# Patient Record
Sex: Female | Born: 1995 | Race: White | Hispanic: No | Marital: Single | Smoking: Never smoker
Health system: Southern US, Community
[De-identification: ages and names within clinical notes are randomized; demographics above are authoritative.]

## PROBLEM LIST (undated history)

## (undated) HISTORY — PX: MEDIAL COLLATERAL LIGAMENT REPAIR, KNEE: SHX2019

---
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
## 2013-03-26 ENCOUNTER — Emergency Department: Payer: Self-pay | Admitting: Emergency Medicine

## 2013-03-26 LAB — URINALYSIS, COMPLETE
BILIRUBIN, UR: NEGATIVE
Glucose,UR: NEGATIVE mg/dL (ref 0–75)
Ketone: NEGATIVE
Leukocyte Esterase: NEGATIVE
NITRITE: NEGATIVE
PH: 6 (ref 4.5–8.0)
PROTEIN: NEGATIVE
RBC,UR: 2 /HPF (ref 0–5)
SPECIFIC GRAVITY: 1.013 (ref 1.003–1.030)
Squamous Epithelial: 1
WBC UR: 1 /HPF (ref 0–5)

## 2013-03-28 LAB — URINE CULTURE

## 2013-03-29 LAB — BETA STREP CULTURE(ARMC)

## 2016-02-20 ENCOUNTER — Emergency Department: Payer: 59

## 2016-02-20 ENCOUNTER — Emergency Department
Admission: EM | Admit: 2016-02-20 | Discharge: 2016-02-21 | Disposition: A | Discharge status: Home / Self Care | Payer: 59 | Attending: Emergency Medicine | Admitting: Emergency Medicine

## 2016-02-20 DIAGNOSIS — R110 Nausea: Secondary | ICD-10-CM | POA: Diagnosis not present

## 2016-02-20 DIAGNOSIS — R42 Dizziness and giddiness: Secondary | ICD-10-CM

## 2016-02-20 DIAGNOSIS — R002 Palpitations: Secondary | ICD-10-CM | POA: Insufficient documentation

## 2016-02-20 LAB — URINALYSIS, COMPLETE (UACMP) WITH MICROSCOPIC
Bacteria, UA: NONE SEEN
Bilirubin Urine: NEGATIVE
Glucose, UA: NEGATIVE mg/dL
Hgb urine dipstick: NEGATIVE
Ketones, ur: NEGATIVE mg/dL
Leukocytes, UA: NEGATIVE
Nitrite: NEGATIVE
PROTEIN: NEGATIVE mg/dL
Specific Gravity, Urine: 1.015 (ref 1.005–1.030)
pH: 6 (ref 5.0–8.0)

## 2016-02-20 LAB — BASIC METABOLIC PANEL
Anion gap: 7 (ref 5–15)
BUN: 13 mg/dL (ref 6–20)
CALCIUM: 9.1 mg/dL (ref 8.9–10.3)
CO2: 27 mmol/L (ref 22–32)
Chloride: 103 mmol/L (ref 101–111)
Creatinine, Ser: 0.88 mg/dL (ref 0.44–1.00)
GFR calc Af Amer: >60 mL/min (ref >60)
GFR calc non Af Amer: >60 mL/min (ref >60)
GLUCOSE: 98 mg/dL (ref 65–99)
Potassium: 4 mmol/L (ref 3.5–5.1)
Sodium: 137 mmol/L (ref 135–145)

## 2016-02-20 LAB — CBC
HCT: 40.3 % (ref 35.0–47.0)
HEMOGLOBIN: 13.7 g/dL (ref 12.0–16.0)
MCH: 29.2 pg (ref 26.0–34.0)
MCHC: 34 g/dL (ref 32.0–36.0)
MCV: 86 fL (ref 80.0–100.0)
Platelets: 342 10*3/uL (ref 150–440)
RBC: 4.69 MIL/uL (ref 3.80–5.20)
RDW: 13.7 % (ref 11.5–14.5)
WBC: 9.6 10*3/uL (ref 3.6–11.0)

## 2016-02-20 LAB — POCT PREGNANCY, URINE: Preg Test, Ur: NEGATIVE

## 2016-02-20 LAB — TROPONIN I

## 2016-02-20 NOTE — ED Provider Notes (Signed)
[facility] Emergency Department Provider Note        Time seen: ----------------------------------------- 11:14 PM on 02/20/2016 -----------------------------------------    I have reviewed the triage vital signs and the nursing notes.   HISTORY  Chief Complaint Dizziness and Nausea    HPI Michelle Atkinson is a 21 y.o. female presents to ER for dizziness and nausea started a month ago that worsened today. Patient denies any vomiting or diarrhea. She states her blood pressure has been elevated, she's been having some chest pain and palpitations. Patient states her heart is not racing but she can feel it beating more forcefully. She's been taking over-the-counter medications without relief.   History reviewed. No pertinent past medical history.  There are no active problems to display for this patient.   Past Surgical History:  Procedure Laterality Date  . mcl surgery      Allergies Patient has no allergy information on record.  Social History Social History  Substance Use Topics  . Smoking status: Never Smoker  . Smokeless tobacco: Never Used  . Alcohol use Yes    Review of Systems Constitutional: Negative for fever. Cardiovascular: Negative for chest pain.Positive for palpitations Respiratory: Negative for shortness of breath. Gastrointestinal: Negative for abdominal pain, Positive for nausea Genitourinary: Negative for dysuria. Musculoskeletal: Negative for back pain. Skin: Negative for rash. Neurological: Negative for headaches, focal weakness or numbness.  10-point ROS otherwise negative.  ____________________________________________   PHYSICAL EXAM:  VITAL SIGNS: ED Triage Vitals [02/20/16 2206]  Enc Vitals Group     BP      Pulse      Resp      Temp      Temp src      SpO2      Weight 145 lb (65.8 kg)     Height 5\' 6"  (1.676 m)     Head Circumference      Peak Flow      Pain Score 7     Pain Loc      Pain Edu?       Excl. in GC?     Constitutional: Alert and oriented. Well appearing and in no distress. Eyes: Conjunctivae are normal. PERRL. Normal extraocular movements. ENT   Head: Normocephalic and atraumatic.   Nose: No congestion/rhinnorhea.   Mouth/Throat: Mucous membranes are moist.   Neck: No stridor. Cardiovascular: Normal rate, regular rhythm. No murmurs, rubs, or gallops. Respiratory: Normal respiratory effort without tachypnea nor retractions. Breath sounds are clear and equal bilaterally. No wheezes/rales/rhonchi. Gastrointestinal: Soft and nontender. Normal bowel sounds Musculoskeletal: Nontender with normal range of motion in all extremities. No lower extremity tenderness nor edema. Neurologic:  Normal speech and language. No gross focal neurologic deficits are appreciated.  Skin:  Skin is warm, dry and intact. No rash noted. Psychiatric: Mood and affect are normal. Speech and behavior are normal.  ____________________________________________  EKG: Interpreted by me. Sinus rhythm with rate of 70 bpm, normal PR interval, normal QRS, normal QT.  ____________________________________________  ED COURSE:  Pertinent labs & imaging results that were available during my care of the patient were reviewed by me and considered in my medical decision making (see chart for details). Patient is in no distress, we will assess with labs and possibly imaging.   Procedures ____________________________________________   LABS (pertinent positives/negatives)  Labs Reviewed  URINALYSIS, COMPLETE (UACMP) WITH MICROSCOPIC - Abnormal; Notable for the following:       Result Value   Color, Urine YELLOW (*)  APPearance CLEAR (*)    Squamous Epithelial / LPF 0-5 (*)    All other components within normal limits  BASIC METABOLIC PANEL  CBC  TROPONIN I  POCT PREGNANCY, URINE  CBG MONITORING, ED  POC URINE PREG, ED   Chest x-ray is  unremarkable ____________________________________________  FINAL ASSESSMENT AND PLAN  Dizziness  Plan: Patient with labs and imaging as dictated above. No specific etiology for her symptoms. Suspect underlying anxiety, she'll be discharged with Ativan and encouraged close outpatient follow-up with her doctor.   Emily Filbert, MD   Note: This note was generated in part or whole with voice recognition software. Voice recognition is usually quite accurate but there are transcription errors that can and very often do occur. I apologize for any typographical errors that were not detected and corrected.     Emily Filbert, MD 02/20/16 [phone]

## 2016-02-20 NOTE — ED Triage Notes (Signed)
Pt presents to ED with c/o dizziness and nausea that started a month ago that worsened today. Pt denies any c/o emesis, denies diarrhea. Pt reports "accelaterated BP, chest pain, and I can feel every heartbeat strongly". Pt reports taking Advil and Pepto-Bismol without relief. Pt also reports "loosing feeling in my right arm, especially when I work out" that started 2 weeks ago.

## 2016-02-21 MED ORDER — LORAZEPAM 1 MG PO TABS: 1.0000 mg | ORAL_TABLET | Freq: Two times a day (BID) | ORAL | 0 refills | Status: AC

## 2016-02-21 NOTE — ED Notes (Signed)
Pt talks in complete sentences ambulatory, reports no pain, discussed with pt the importance of drinking water, avoiding caffeine drinks. Pt denies any other symptom at present.

## 2016-02-21 NOTE — ED Notes (Signed)
Pt verbalizes understanding of discharge instructions.

## 2016-05-28 ENCOUNTER — Encounter: Payer: Self-pay | Admitting: Internal Medicine

## 2016-05-28 ENCOUNTER — Other Ambulatory Visit (INDEPENDENT_AMBULATORY_CARE_PROVIDER_SITE_OTHER): Payer: PRIVATE HEALTH INSURANCE

## 2016-05-28 ENCOUNTER — Ambulatory Visit (INDEPENDENT_AMBULATORY_CARE_PROVIDER_SITE_OTHER): Payer: 59 | Admitting: Internal Medicine

## 2016-05-28 ENCOUNTER — Encounter (INDEPENDENT_AMBULATORY_CARE_PROVIDER_SITE_OTHER): Payer: Self-pay

## 2016-05-28 VITALS — BP 108/60 | HR 84 | Ht 66.0 in | Wt 146.0 lb

## 2016-05-28 DIAGNOSIS — R1013 Epigastric pain: Secondary | ICD-10-CM | POA: Diagnosis not present

## 2016-05-28 DIAGNOSIS — R110 Nausea: Secondary | ICD-10-CM

## 2016-05-28 LAB — COMPREHENSIVE METABOLIC PANEL
ALT: 18 U/L (ref 0–35)
AST: 20 U/L (ref 0–37)
Albumin: 4.6 g/dL (ref 3.5–5.2)
Alkaline Phosphatase: 50 U/L (ref 39–117)
BUN: 14 mg/dL (ref 6–23)
CO2: 29 mEq/L (ref 19–32)
Calcium: 9.5 mg/dL (ref 8.4–10.5)
Chloride: 104 mEq/L (ref 96–112)
Creatinine, Ser: 0.87 mg/dL (ref 0.40–1.20)
GFR: 86.23 mL/min (ref >60.00)
Glucose, Bld: 92 mg/dL (ref 70–99)
POTASSIUM: 4.5 meq/L (ref 3.5–5.1)
SODIUM: 139 meq/L (ref 135–145)
Total Bilirubin: 0.4 mg/dL (ref 0.2–1.2)
Total Protein: 7.2 g/dL (ref 6.0–8.3)

## 2016-05-28 LAB — HIGH SENSITIVITY CRP: CRP HIGH SENSITIVITY: 1.23 mg/L (ref 0.000–5.000)

## 2016-05-28 LAB — CBC WITH DIFFERENTIAL/PLATELET
BASOS ABS: 0 10*3/uL (ref 0.0–0.1)
BASOS PCT: 0.4 % (ref 0.0–3.0)
EOS ABS: 0.2 10*3/uL (ref 0.0–0.7)
Eosinophils Relative: 2.6 % (ref 0.0–5.0)
HCT: 41.8 % (ref 36.0–46.0)
HEMOGLOBIN: 14.2 g/dL (ref 12.0–15.0)
LYMPHS PCT: 25.1 % (ref 12.0–46.0)
Lymphs Abs: 2 10*3/uL (ref 0.7–4.0)
MCHC: 33.9 g/dL (ref 30.0–36.0)
MCV: 86 fl (ref 78.0–100.0)
MONO ABS: 0.4 10*3/uL (ref 0.1–1.0)
Monocytes Relative: 5.4 % (ref 3.0–12.0)
NEUTROS ABS: 5.2 10*3/uL (ref 1.4–7.7)
Neutrophils Relative %: 66.5 % (ref 43.0–77.0)
PLATELETS: 343 10*3/uL (ref 150.0–400.0)
RBC: 4.86 Mil/uL (ref 3.87–5.11)
RDW: 12.6 % (ref 11.5–15.5)
WBC: 7.8 10*3/uL (ref 4.0–10.5)

## 2016-05-28 LAB — TSH: TSH: 3.18 u[IU]/mL (ref 0.35–4.50)

## 2016-05-28 LAB — IGA: IgA: 123 mg/dL (ref 68–378)

## 2016-05-28 LAB — LIPASE: LIPASE: 15 U/L (ref 11.0–59.0)

## 2016-05-28 MED ORDER — ONDANSETRON 4 MG PO TBDP: ORAL_TABLET | ORAL | 1 refills | Status: AC

## 2016-05-28 MED ORDER — PANTOPRAZOLE SODIUM 40 MG PO TBEC: 40.0000 mg | DELAYED_RELEASE_TABLET | Freq: Two times a day (BID) | ORAL | 2 refills | Status: DC

## 2016-05-28 MED FILL — ONDANSETRON ODT 4 MG TABLET: 4 | 5 days supply | Qty: 30 | Fill #0

## 2016-05-28 MED FILL — PANTOPRAZOLE SOD DR 40 MG T: 40 | 30 days supply | Qty: 60 | Fill #0

## 2016-05-28 NOTE — Progress Notes (Signed)
Patient ID: Michelle Atkinson, female   DOB: [birth year], 21 y.o.   MRN: [id number] HPI: Michelle Atkinson is a 21 yo female currently a senior at [employer] with no significant PMH here for 6 months of now worsening nausea, associated with upper abdominal pain.  She does have a history of vertigo but this has improved. Initially nausea was associated with vertigo but vertigo has not been an issue recently and nausea has worsened.  She reports her symptoms started with nausea and heartburn/reflux type symptoms around October or November 2017. She's had some epigastric discomfort and stomach "churning". This is a gnawing type discomfort in the left upper quadrant and epigastrium. She reports she has no dysphagia or odynophagia. She has not weight. She does at times feel full quickly. She has been using Pepto-Bismol frequently throughout the day. With this her stools have been dark at times black. Also she's noticed a change in her bowel habits. Stools have been looser and at 3-4 times per day. Despite her nausea which is frequent and daily she has not had vomiting. She freely feels dehydrated.  She started omeprazole over-the-counter in early March. This helped "a lot". She completed a 14 day course and when discontinuing omeprazole symptoms returned severely. She saw her primary care provider in [place], where she is from and she increased her omeprazole to 40 mg daily. This was helpful but not as much as previously and so she changed herself to 20 mg twice a day before meals. This has helped but nausea persists.  She reports she does drink alcohol and at times this helps her nausea because it "makes me non-". She does not use marijuana or other illicit drugs. Tobacco.  No significant family history that her grandfather had Crohn's disease. No known family history of GI tract malignancy or celiac disease.  She does report having been under stress recently in her senior year in college. She has been interviewing for a  job with the [employer]. She should hear about this soon. She has majored in sports management. Graduation is about 3 weeks away.   History reviewed. No pertinent past medical history.  Past Surgical History:  Procedure Laterality Date  . MEDIAL COLLATERAL LIGAMENT REPAIR, KNEE Right    Meds: Omeprazole 20 mg BIDAC Ativan on the list -- not using  No Known Allergies  Family History  Problem Relation Age of Onset  . Crohn's disease Maternal Grandfather     Social History  Substance Use Topics  . Smoking status: Never Smoker  . Smokeless tobacco: Never Used  . Alcohol use Yes     Comment: 12 drinks weekly     ROS: As per history of present illness, otherwise negative  BP 108/60   Pulse 84   Ht  (1.676 m)   Wt 146 lb (66.2 kg)   BMI 23.57 kg/m  Constitutional: Well-developed and well-nourished. No distress. HEENT: Normocephalic and atraumatic. Oropharynx is clear and moist. Conjunctivae are normal.  No scleral icterus. Neck: Neck supple. Trachea midline. Cardiovascular: Normal rate, regular rhythm and intact distal pulses. No M/R/G Pulmonary/chest: Effort normal and breath sounds normal. No wheezing, rales or rhonchi. Abdominal: Soft, mild epigastric tenderness, no rebound/guarding, nontender, nondistended. Bowel sounds active throughout. There are no masses palpable. No hepatosplenomegaly. Extremities: no clubbing, cyanosis, or edema Neurological: Alert and oriented to person place and time. Skin: Skin is warm and dry.  Psychiatric: Normal mood and affect. Behavior is normal.  RELEVANT LABS AND IMAGING: CBC  Component Value Date/Time   WBC 9.6 02/20/2016 2208   RBC 4.69 02/20/2016 2208   HGB 13.7 02/20/2016 2208   HCT 40.3 02/20/2016 2208   PLT 342 02/20/2016 2208   MCV 86.0 02/20/2016 2208   MCH 29.2 02/20/2016 2208   MCHC 34.0 02/20/2016 2208   RDW 13.7 02/20/2016 2208    CMP     Component Value Date/Time   NA 137 02/20/2016 2208   K 4.0  02/20/2016 2208   CL 103 02/20/2016 2208   CO2 27 02/20/2016 2208   GLUCOSE 98 02/20/2016 2208   BUN 13 02/20/2016 2208   CREATININE 0.88 02/20/2016 2208   CALCIUM 9.1 02/20/2016 2208   GFRNONAA >60 02/20/2016 2208   GFRAA >60 02/20/2016 2208    ASSESSMENT/PLAN: 21 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 Severe nausea/mild epigastric pain -- symptoms are most consistent with dyspepsia partially PPI responsive. Gallbladder pathology also considered but felt less likely. H. pylori is a possibility. Will workup as follows: CBC, CMP, CRP, celiac disease, lipase and TSH. Upper endoscopy recommended to evaluate for ulcer disease and rule out H. pylori. We discussed the risks, benefits and alternatives and she is agreeable and wishes to proceed. Given lack of complete response to omeprazole will try pantoprazole 40 mg twice a day before meals. Zofran 4 mg 1-2 tablets every 8 hours as needed for nausea. Abdominal ultrasound to rule out gallbladder pathology.

## 2016-05-28 NOTE — Patient Instructions (Addendum)
Your physician has requested that you go to the basement for the following lab work before leaving today: CBC, CMP, CRP, CELIAC, TSH,LIPASE   You have been scheduled for an endoscopy. Please follow written instructions given to you at your visit today. If you use inhalers (even only as needed), please bring them with you on the day of your procedure. Your physician has requested that you go to www.startemmi.com and enter the access code given to you at your visit today. This web site gives a general overview about your procedure. However, you should still follow specific instructions given to you by our office regarding your preparation for the procedure.  We have sent the following medications to your pharmacy for you to pick up at your convenience: Zofran Pantoprazole (in place of omeprazole)  Discontinue omeprazole.  You have been scheduled for an abdominal ultrasound at [facility] (1st floor of hospital) on Friday 06/04/16 at 8:00 am. Please arrive 15 minutes prior to your appointment for registration. Make certain not to have anything to eat or drink 6 hours prior to your appointment. Should you need to reschedule your appointment, please contact radiology at [phone]. This test typically takes about 30 minutes to perform.  If you are age 63 or older, your body mass index should be between 23-30. Your Body mass index is 23.57 kg/m. If this is out of the aforementioned range listed, please consider follow up with your Primary Care Provider.  If you are age 27 or younger, your body mass index should be between 19-25. Your Body mass index is 23.57 kg/m. If this is out of the aformentioned range listed, please consider follow up with your Primary Care Provider.

## 2016-05-31 LAB — TISSUE TRANSGLUTAMINASE, IGA: Tissue Transglutaminase Ab, IgA: 1 U/mL (ref <4)

## 2016-06-04 ENCOUNTER — Ambulatory Visit (HOSPITAL_COMMUNITY)
Admission: RE | Admit: 2016-06-04 | Discharge: 2016-06-04 | Disposition: A | Discharge status: Home / Self Care | Payer: PRIVATE HEALTH INSURANCE | Source: Ambulatory Visit | Attending: Internal Medicine | Admitting: Internal Medicine

## 2016-06-04 DIAGNOSIS — R110 Nausea: Secondary | ICD-10-CM | POA: Diagnosis not present

## 2016-06-04 DIAGNOSIS — R1013 Epigastric pain: Secondary | ICD-10-CM | POA: Insufficient documentation

## 2016-06-10 ENCOUNTER — Encounter: Payer: Self-pay | Admitting: Internal Medicine

## 2016-06-10 ENCOUNTER — Ambulatory Visit (AMBULATORY_SURGERY_CENTER): Payer: PRIVATE HEALTH INSURANCE | Admitting: Internal Medicine

## 2016-06-10 ENCOUNTER — Telehealth: Payer: Self-pay | Admitting: Gastroenterology

## 2016-06-10 VITALS — BP 108/60 | HR 71 | Temp 97.8°F | Resp 12 | Ht 66.0 in | Wt 146.0 lb

## 2016-06-10 DIAGNOSIS — R110 Nausea: Secondary | ICD-10-CM

## 2016-06-10 DIAGNOSIS — K2980 Duodenitis without bleeding: Secondary | ICD-10-CM | POA: Diagnosis not present

## 2016-06-10 DIAGNOSIS — K2950 Unspecified chronic gastritis without bleeding: Secondary | ICD-10-CM

## 2016-06-10 MED ORDER — SODIUM CHLORIDE 0.9 % IV SOLN: 500.0000 mL | INTRAVENOUS | Status: AC

## 2016-06-10 NOTE — Telephone Encounter (Signed)
Patient called this evening. She had an EGD this morning with Dr. Rhea BeltonPyrtle. She states within the past hour having some fleeting chest discomfort / epigastric pain. She has felt fine after the procedure until the past hour. No vomiting, no fevers. No shortness of breath or breathing difficulty. She's not sure if she is having heartburn. She has been tolerating PO okay after the procedure.  She had a fairly unremarkable EGD, no high risk interventions, some biopsies taken from the stomach / small bowel, low risk for complication from this type of procedure. Recommend she try some TUMS she has at home and take her protonix (she hasn't taken it today). Will see if this helps. I reviewed alarm symptoms with her which would warrant going to the hospital overnight - she has none of these at present as above. If she feels worse overnight she can call me back   I told her we would call her tomorrow to check up on her.    Alroy DustJay, FYI

## 2016-06-10 NOTE — Op Note (Signed)
[facility] Patient Name: Michelle Atkinson Procedure Date: 06/10/2016 8:09 AM MRN: [id number] Endoscopist: Michelle Fiedler , MD Age: 21 Referring MD:  Date of Birth: 19-Mar-1995 Gender: Female Account #: [id number] Procedure:                Upper GI endoscopy Indications:              Epigastric abdominal pain, Nausea Medicines:                Monitored Anesthesia Care Procedure:                Pre-Anesthesia Assessment:                           - Prior to the procedure, a History and Physical                            was performed, and patient medications and                            allergies were reviewed. The patient's tolerance of                            previous anesthesia was also reviewed. The risks                            and benefits of the procedure and the sedation                            options and risks were discussed with the patient.                            All questions were answered, and informed consent                            was obtained. Prior Anticoagulants: The patient has                            taken no previous anticoagulant or antiplatelet                            agents. ASA Grade Assessment: I - A normal, healthy                            patient. After reviewing the risks and benefits,                            the patient was deemed in satisfactory condition to                            undergo the procedure.                           After obtaining informed consent, the endoscope was  passed under direct vision. Throughout the                            procedure, the patient's blood pressure, pulse, and                            oxygen saturations were monitored continuously. The                            Model GIF-HQ190 [id number] scope was introduced                            through the mouth, and advanced to the second part                            of duodenum. The upper GI  endoscopy was                            accomplished without difficulty. The patient                            tolerated the procedure well. Scope In: Scope Out: Findings:                 The examined esophagus was normal.                           The entire examined stomach was normal. Biopsies                            were taken with a cold forceps for histology and                            Helicobacter pylori testing.                           The cardia and gastric fundus were normal on                            retroflexion.                           A few localized pinpoint erosions without bleeding                            were found in the duodenal bulb.                           The second portion of the duodenum was normal.                            Biopsies for histology were taken with a cold                            forceps for evaluation of celiac disease. Complications:  No immediate complications. Estimated Blood Loss:     Estimated blood loss was minimal. Impression:               - Normal esophagus.                           - Normal stomach. Biopsied.                           - Duodenal erosions without bleeding.                           - Normal second portion of the duodenum. Biopsied. Recommendation:           - Patient has a contact number available for                            emergencies. The signs and symptoms of potential                            delayed complications were discussed with the                            patient. Return to normal activities tomorrow.                            Written discharge instructions were provided to the                            patient.                           - Resume previous diet.                           - Continue present medications including                            pantoprazole 40 mg twice daily and as needed Zofran                            (ondansetron).                            - Await pathology results.                           - Avoid ibuprofen, naproxen, or other non-steroidal                            anti-inflammatory drugs. Michelle FiedlerJay M Chrysta Fulcher, MD 06/10/2016 8:34:50 AM This report has been signed electronically.

## 2016-06-10 NOTE — Progress Notes (Signed)
Called to room to assist during endoscopic procedure.  Patient ID and intended procedure confirmed with present staff. Received instructions for my participation in the procedure from the performing physician.  

## 2016-06-10 NOTE — Progress Notes (Signed)
Report to PACU, RN, vss, BBS= Clear.  

## 2016-06-10 NOTE — Progress Notes (Signed)
No problems noted in the recovery room. maw 

## 2016-06-10 NOTE — Progress Notes (Signed)
Pt's states no medical or surgical changes since previsit or office visit. 

## 2016-06-10 NOTE — Patient Instructions (Signed)
YOU HAD AN ENDOSCOPIC PROCEDURE TODAY AT THE [facility]:   Refer to the procedure report that was given to you for any specific questions about what was found during the examination.  If the procedure report does not answer your questions, please call your gastroenterologist to clarify.  If you requested that your care partner not be given the details of your procedure findings, then the procedure report has been included in a sealed envelope for you to review at your convenience later.  YOU SHOULD EXPECT: Some feelings of bloating in the abdomen. Passage of more gas than usual.  Walking can help get rid of the air that was put into your GI tract during the procedure and reduce the bloating. If you had a lower endoscopy (such as a colonoscopy or flexible sigmoidoscopy) you may notice spotting of blood in your stool or on the toilet paper. If you underwent a bowel prep for your procedure, you may not have a normal bowel movement for a few days.  Please Note:  You might notice some irritation and congestion in your nose or some drainage.  This is from the oxygen used during your procedure.  There is no need for concern and it should clear up in a day or so.  SYMPTOMS TO REPORT IMMEDIATELY:    Following upper endoscopy (EGD)  Vomiting of blood or coffee ground material  New chest pain or pain under the shoulder blades  Painful or persistently difficult swallowing  New shortness of breath  Fever of 100F or higher  Black, tarry-looking stools  For urgent or emergent issues, a gastroenterologist can be reached at any hour by calling [phone].   DIET:  We do recommend a small meal at first, but then you may proceed to your regular diet.  Drink plenty of fluids but you should avoid alcoholic beverages for 24 hours.  ACTIVITY:  You should plan to take it easy for the rest of today and you should NOT DRIVE or use heavy machinery until tomorrow (because of the sedation medicines used  during the test).    FOLLOW UP: Our staff will call the number listed on your records the next business day following your procedure to check on you and address any questions or concerns that you may have regarding the information given to you following your procedure. If we do not reach you, we will leave a message.  However, if you are feeling well and you are not experiencing any problems, there is no need to return our call.  We will assume that you have returned to your regular daily activities without incident.  If any biopsies were taken you will be contacted by phone or by letter within the next 1-3 weeks.  Please call us at [phone] if you have not heard about the biopsies in 3 weeks.    SIGNATURES/CONFIDENTIALITY: You and/or your care partner have signed paperwork which will be entered into your electronic medical record.  These signatures attest to the fact that that the information above on your After Visit Summary has been reviewed and is understood.  Full responsibility of the confidentiality of this discharge information lies with you and/or your care-partner.    Continue taking pantorrazole 40 mg twice daily and as needed zofran. Avoid ibuprofen, naproxen, pr other non-steroidal anti-inflammatory drugs. You may resume your other current medications today. Await biopsy results. Please call if any questions or concerns.

## 2016-06-11 ENCOUNTER — Telehealth: Payer: Self-pay

## 2016-06-11 ENCOUNTER — Telehealth: Payer: Self-pay | Admitting: *Deleted

## 2016-06-11 NOTE — Telephone Encounter (Signed)
Pt states that her stomach is still a little off but she has not had any more chest discomfort. Pt knows to take her protonix and tums as needed and to call back if she has any more problems. Pt verbalized understanding.

## 2016-06-11 NOTE — Telephone Encounter (Signed)
No answer. Number identifier. Message left to call if questions or concerns. 

## 2016-06-11 NOTE — Telephone Encounter (Signed)
No answer. Number identifier. Message left to call if questions or concerns and attempt will be made later in the day to reach.

## 2016-06-11 NOTE — Telephone Encounter (Signed)
Linda Please call the patient to check on her.  If worse needs to be seen or to urgent care for 2v cxr and abd series Doubt complication from egd Thanks Brett CanalesSteve for your help last night

## 2016-06-17 ENCOUNTER — Other Ambulatory Visit: Payer: Self-pay

## 2016-07-30 ENCOUNTER — Telehealth: Payer: Self-pay | Admitting: Internal Medicine

## 2016-07-30 MED ORDER — PANTOPRAZOLE SODIUM 40 MG PO TBEC: 40.0000 mg | DELAYED_RELEASE_TABLET | Freq: Two times a day (BID) | ORAL | 2 refills | Status: AC

## 2016-07-30 NOTE — Telephone Encounter (Signed)
Rx sent 

## 2018-03-01 IMAGING — US US ABDOMEN COMPLETE
1 series · 14 of 25 positions shown · non-contrast
Comparison: None.

CLINICAL DATA: Intermittent abdominal pain.

EXAM:
ABDOMEN ULTRASOUND COMPLETE

[Series 1: us abdomen complete · 0.20mm/px · 14 of 99 slices shown]
[im 1/99]
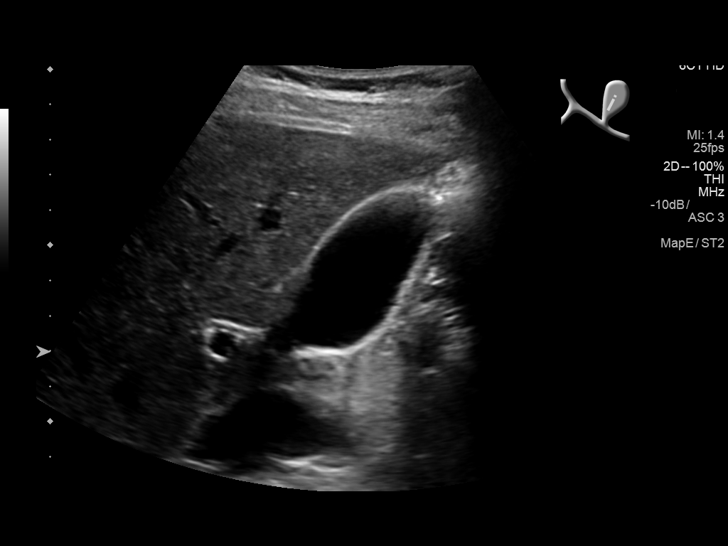
[im 9/99]
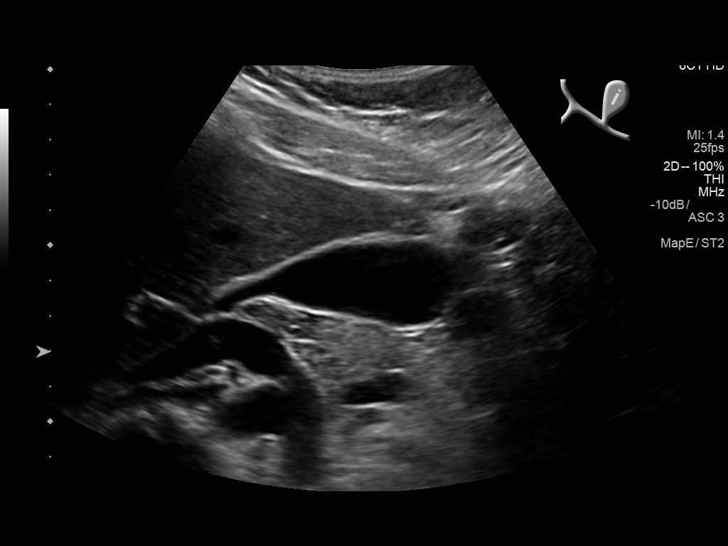
[im 17/99]
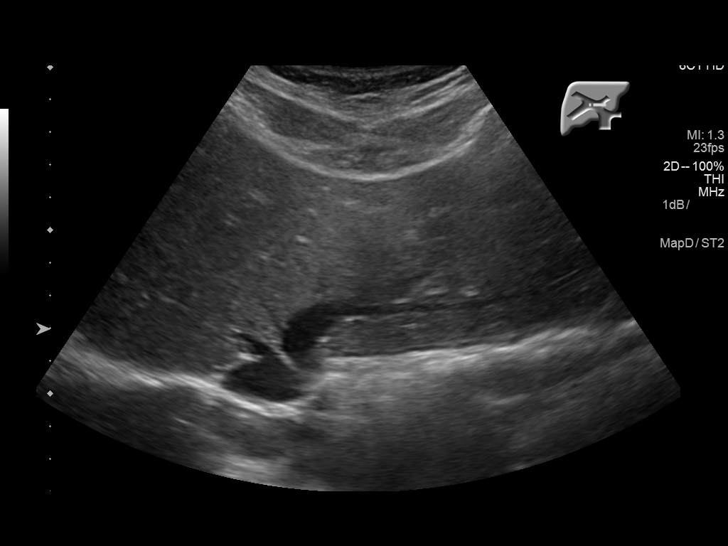
[im 25/99]
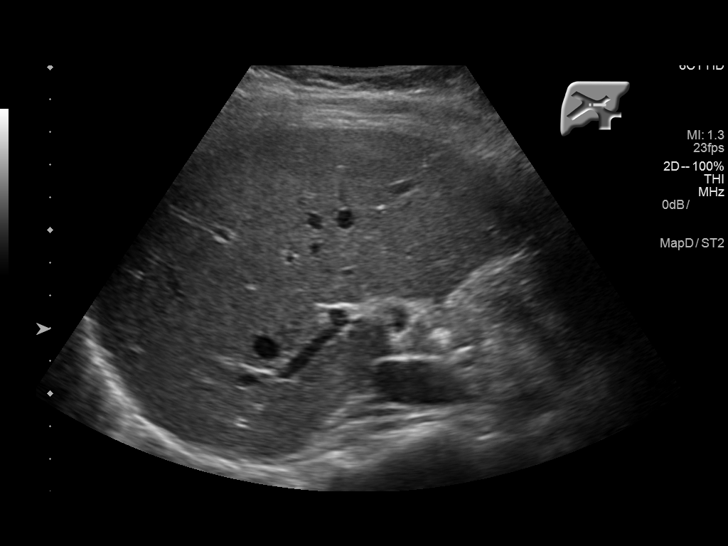
[im 33/99]
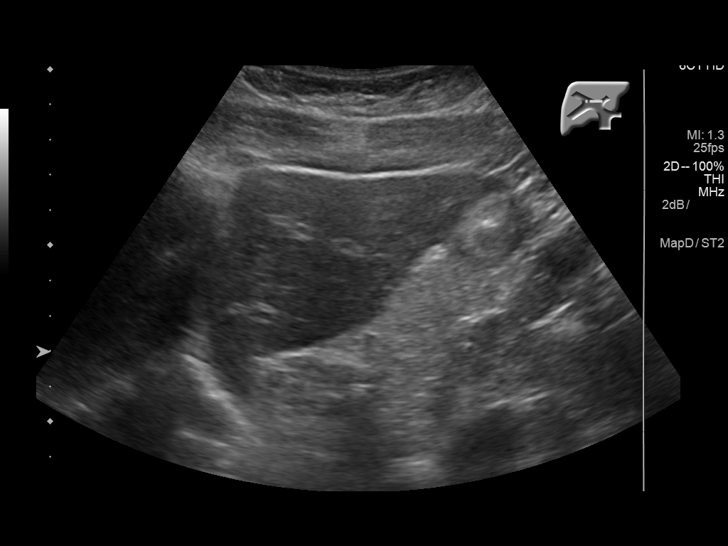
[im 37/99]
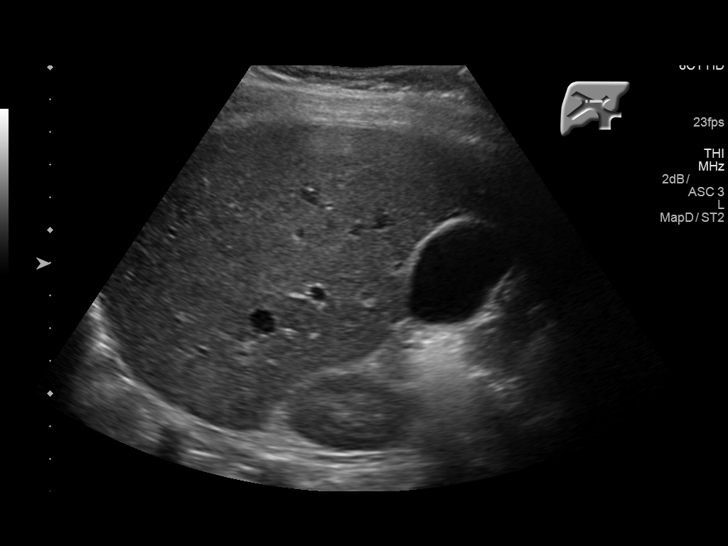
[im 45/99]
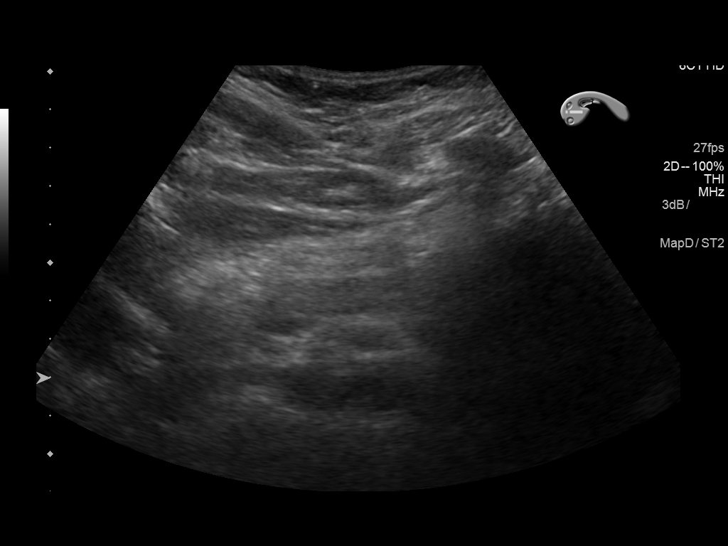
[im 54/99]
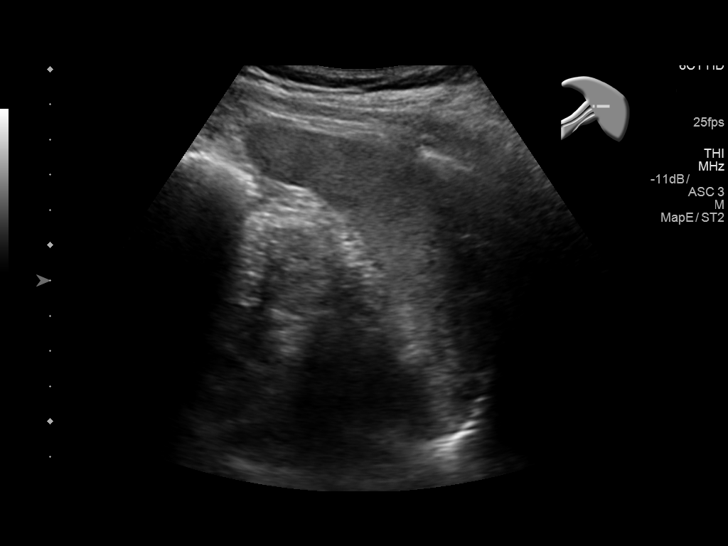
[im 62/99]
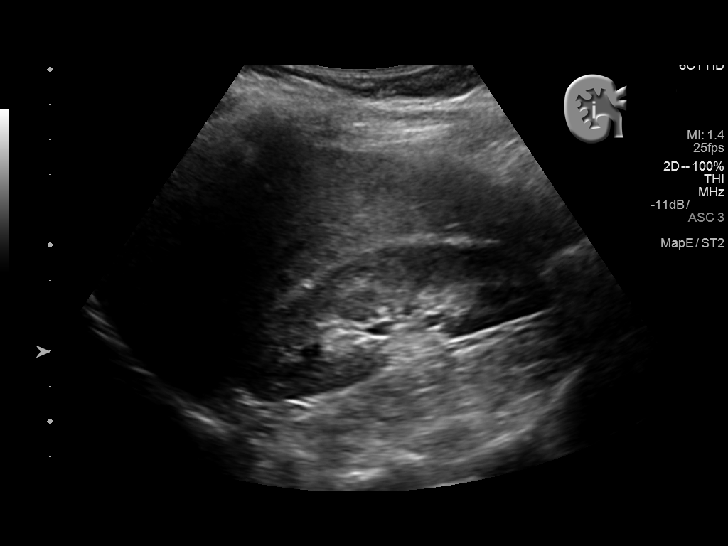
[im 66/99]
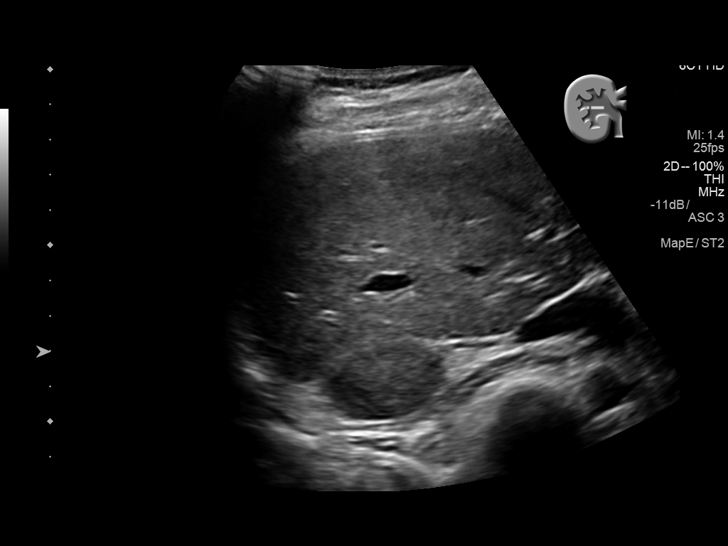
[im 74/99]
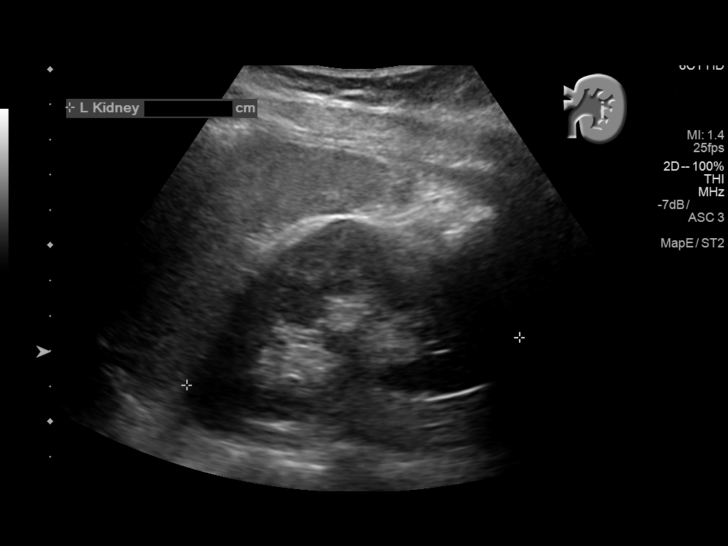
[im 82/99]
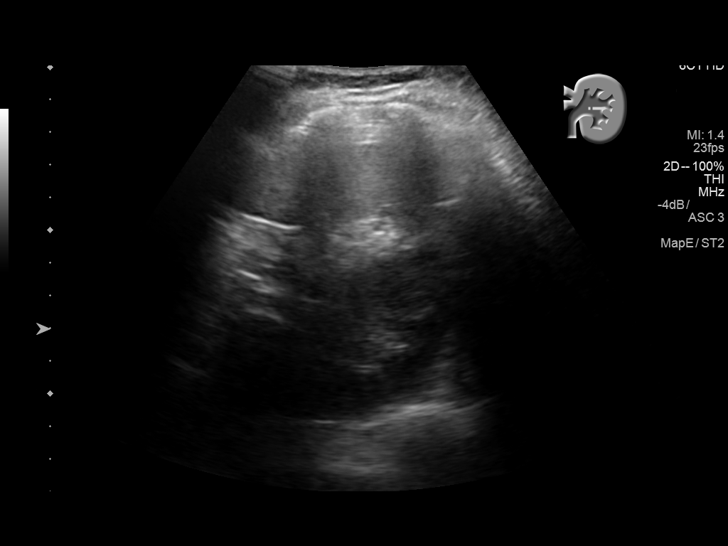
[im 90/99]
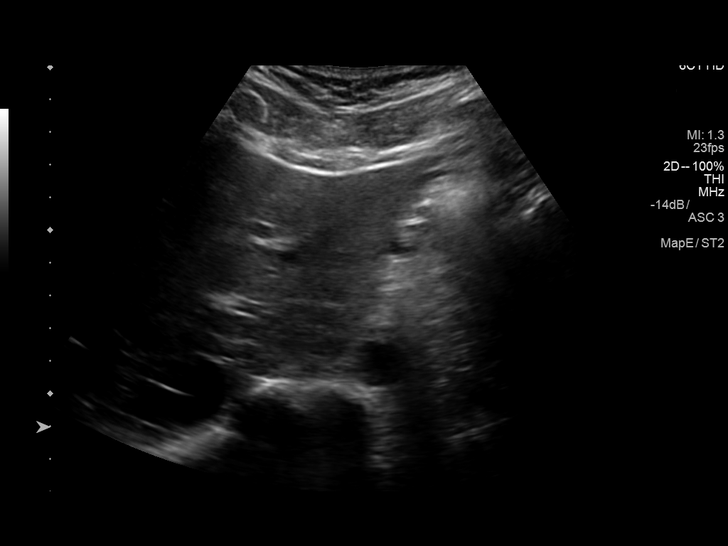
[im 99/99]
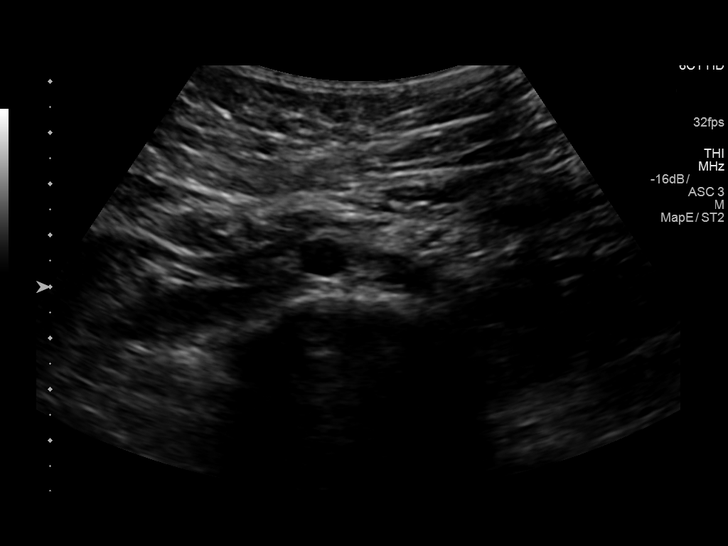

[14 of 25 positions shown; findings below may reference images not displayed]

FINDINGS: Gallbladder: No gallstones or wall thickening visualized. No
sonographic Murphy sign noted by sonographer.

Common bile duct: Diameter: 2.8 mm

Liver: No focal lesion identified. Within normal limits in
parenchymal echogenicity.

IVC: No abnormality visualized.

Pancreas: Limited visualization due to shadowing bowel gas with no
obvious abnormality.

Spleen: Size and appearance within normal limits.

Right Kidney: Length: 9.3 cm. Echogenicity within normal limits. No
mass or hydronephrosis visualized.

Left Kidney: Length: 9.5 cm. Echogenicity within normal limits. No
mass or hydronephrosis visualized.

Abdominal aorta: No aneurysm visualized.

Other findings: None.
IMPRESSION: 1. No cause for the patient's symptoms identified.
